# Patient Record
Sex: Female | Born: 1975 | Race: White | Hispanic: No | Smoking: Never smoker
Health system: Southern US, Community
[De-identification: ages and names within clinical notes are randomized; demographics above are authoritative.]

## PROBLEM LIST (undated history)

## (undated) DIAGNOSIS — F419 Anxiety disorder, unspecified: Secondary | ICD-10-CM

## (undated) DIAGNOSIS — N809 Endometriosis, unspecified: Secondary | ICD-10-CM

## (undated) HISTORY — DX: Anxiety disorder, unspecified: F41.9

## (undated) HISTORY — DX: Endometriosis, unspecified: N80.9

---

## 1998-01-04 HISTORY — PX: TUBAL LIGATION: SHX77

## 1999-06-23 ENCOUNTER — Other Ambulatory Visit: Admission: RE | Admit: 1999-06-23 | Discharge: 1999-06-23 | Payer: Self-pay | Admitting: Obstetrics and Gynecology

## 2000-08-09 ENCOUNTER — Other Ambulatory Visit: Admission: RE | Admit: 2000-08-09 | Discharge: 2000-08-09 | Payer: Self-pay | Admitting: Obstetrics and Gynecology

## 2001-03-30 ENCOUNTER — Ambulatory Visit (HOSPITAL_COMMUNITY): Admission: RE | Admit: 2001-03-30 | Discharge: 2001-03-30 | Payer: Self-pay | Admitting: Obstetrics and Gynecology

## 2002-01-04 HISTORY — PX: CHOLECYSTECTOMY, LAPAROSCOPIC: SHX56

## 2002-12-13 ENCOUNTER — Inpatient Hospital Stay (HOSPITAL_COMMUNITY): Admission: EM | Admit: 2002-12-13 | Discharge: 2002-12-14 | Payer: Self-pay | Admitting: Emergency Medicine

## 2016-05-15 ENCOUNTER — Emergency Department (HOSPITAL_COMMUNITY)
Admission: EM | Admit: 2016-05-15 | Discharge: 2016-05-15 | Disposition: A | Discharge status: Home / Self Care | Payer: BLUE CROSS/BLUE SHIELD | Attending: Emergency Medicine | Admitting: Emergency Medicine

## 2016-05-15 ENCOUNTER — Emergency Department (HOSPITAL_COMMUNITY): Payer: BLUE CROSS/BLUE SHIELD

## 2016-05-15 ENCOUNTER — Encounter (HOSPITAL_COMMUNITY): Payer: Self-pay | Admitting: Emergency Medicine

## 2016-05-15 DIAGNOSIS — S199XXA Unspecified injury of neck, initial encounter: Secondary | ICD-10-CM | POA: Diagnosis not present

## 2016-05-15 DIAGNOSIS — S8002XA Contusion of left knee, initial encounter: Secondary | ICD-10-CM | POA: Diagnosis not present

## 2016-05-15 DIAGNOSIS — S301XXA Contusion of abdominal wall, initial encounter: Secondary | ICD-10-CM | POA: Insufficient documentation

## 2016-05-15 DIAGNOSIS — S8012XA Contusion of left lower leg, initial encounter: Secondary | ICD-10-CM | POA: Insufficient documentation

## 2016-05-15 DIAGNOSIS — S3993XA Unspecified injury of pelvis, initial encounter: Secondary | ICD-10-CM | POA: Diagnosis not present

## 2016-05-15 DIAGNOSIS — S161XXA Strain of muscle, fascia and tendon at neck level, initial encounter: Secondary | ICD-10-CM

## 2016-05-15 DIAGNOSIS — Y999 Unspecified external cause status: Secondary | ICD-10-CM | POA: Insufficient documentation

## 2016-05-15 DIAGNOSIS — S0990XA Unspecified injury of head, initial encounter: Secondary | ICD-10-CM | POA: Diagnosis not present

## 2016-05-15 DIAGNOSIS — R079 Chest pain, unspecified: Secondary | ICD-10-CM | POA: Diagnosis not present

## 2016-05-15 DIAGNOSIS — R109 Unspecified abdominal pain: Secondary | ICD-10-CM | POA: Diagnosis not present

## 2016-05-15 DIAGNOSIS — S20211A Contusion of right front wall of thorax, initial encounter: Secondary | ICD-10-CM | POA: Diagnosis not present

## 2016-05-15 DIAGNOSIS — R51 Headache: Secondary | ICD-10-CM | POA: Diagnosis not present

## 2016-05-15 DIAGNOSIS — R102 Pelvic and perineal pain: Secondary | ICD-10-CM | POA: Diagnosis not present

## 2016-05-15 DIAGNOSIS — S8992XA Unspecified injury of left lower leg, initial encounter: Secondary | ICD-10-CM | POA: Diagnosis not present

## 2016-05-15 DIAGNOSIS — Y92410 Unspecified street and highway as the place of occurrence of the external cause: Secondary | ICD-10-CM | POA: Diagnosis not present

## 2016-05-15 DIAGNOSIS — Y939 Activity, unspecified: Secondary | ICD-10-CM | POA: Diagnosis not present

## 2016-05-15 DIAGNOSIS — M542 Cervicalgia: Secondary | ICD-10-CM | POA: Diagnosis not present

## 2016-05-15 DIAGNOSIS — M79662 Pain in left lower leg: Secondary | ICD-10-CM | POA: Diagnosis not present

## 2016-05-15 DIAGNOSIS — S299XXA Unspecified injury of thorax, initial encounter: Secondary | ICD-10-CM | POA: Diagnosis not present

## 2016-05-15 DIAGNOSIS — S3991XA Unspecified injury of abdomen, initial encounter: Secondary | ICD-10-CM | POA: Diagnosis not present

## 2016-05-15 LAB — URINALYSIS, ROUTINE W REFLEX MICROSCOPIC
Bilirubin Urine: NEGATIVE
Glucose, UA: NEGATIVE mg/dL
Hgb urine dipstick: NEGATIVE
Ketones, ur: 5 mg/dL — AB
LEUKOCYTES UA: NEGATIVE
NITRITE: NEGATIVE
PROTEIN: NEGATIVE mg/dL
Specific Gravity, Urine: 1.018 (ref 1.005–1.030)
pH: 5 (ref 5.0–8.0)

## 2016-05-15 LAB — BASIC METABOLIC PANEL
Anion gap: 9 (ref 5–15)
BUN: 10 mg/dL (ref 6–20)
CALCIUM: 9 mg/dL (ref 8.9–10.3)
CO2: 24 mmol/L (ref 22–32)
Chloride: 106 mmol/L (ref 101–111)
Creatinine, Ser: 0.7 mg/dL (ref 0.44–1.00)
Glucose, Bld: 100 mg/dL — ABNORMAL HIGH (ref 65–99)
Potassium: 3.7 mmol/L (ref 3.5–5.1)
SODIUM: 139 mmol/L (ref 135–145)

## 2016-05-15 LAB — CBC WITH DIFFERENTIAL/PLATELET
BASOS PCT: 0 %
Basophils Absolute: 0 10*3/uL (ref 0.0–0.1)
EOS ABS: 0.1 10*3/uL (ref 0.0–0.7)
EOS PCT: 2 %
HCT: 35.1 % — ABNORMAL LOW (ref 36.0–46.0)
Hemoglobin: 11.8 g/dL — ABNORMAL LOW (ref 12.0–15.0)
Lymphocytes Relative: 25 %
Lymphs Abs: 1.9 10*3/uL (ref 0.7–4.0)
MCH: 28.8 pg (ref 26.0–34.0)
MCHC: 33.6 g/dL (ref 30.0–36.0)
MCV: 85.6 fL (ref 78.0–100.0)
Monocytes Absolute: 0.4 10*3/uL (ref 0.1–1.0)
Monocytes Relative: 6 %
Neutro Abs: 5.2 10*3/uL (ref 1.7–7.7)
Neutrophils Relative %: 67 %
PLATELETS: 345 10*3/uL (ref 150–400)
RBC: 4.1 MIL/uL (ref 3.87–5.11)
RDW: 12.4 % (ref 11.5–15.5)
WBC: 7.7 10*3/uL (ref 4.0–10.5)

## 2016-05-15 LAB — PREGNANCY, URINE: PREG TEST UR: NEGATIVE

## 2016-05-15 MED ORDER — HYDROCODONE-ACETAMINOPHEN 5-325 MG PO TABS: 1.0000 | ORAL_TABLET | ORAL | 0 refills | Status: DC | PRN

## 2016-05-15 MED ORDER — IOPAMIDOL (ISOVUE-300) INJECTION 61%
INTRAVENOUS | Status: AC
  Administered 2016-05-15: 100 mL via INTRAVENOUS
  Filled 2016-05-15: qty 100

## 2016-05-15 MED ORDER — SODIUM CHLORIDE 0.9 % IV BOLUS (SEPSIS)
1000.0000 mL | Freq: Once | INTRAVENOUS | Status: AC
  Administered 2016-05-15: 1000 mL via INTRAVENOUS

## 2016-05-15 MED ORDER — CYCLOBENZAPRINE HCL 10 MG PO TABS: 10.0000 mg | ORAL_TABLET | Freq: Two times a day (BID) | ORAL | 0 refills | Status: DC | PRN

## 2016-05-15 MED ORDER — IBUPROFEN 600 MG PO TABS: 600.0000 mg | ORAL_TABLET | Freq: Four times a day (QID) | ORAL | 0 refills | Status: DC | PRN
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
## 2016-05-17 DIAGNOSIS — S335XXA Sprain of ligaments of lumbar spine, initial encounter: Secondary | ICD-10-CM | POA: Diagnosis not present

## 2016-05-17 DIAGNOSIS — S134XXA Sprain of ligaments of cervical spine, initial encounter: Secondary | ICD-10-CM | POA: Diagnosis not present

## 2016-05-17 DIAGNOSIS — S233XXA Sprain of ligaments of thoracic spine, initial encounter: Secondary | ICD-10-CM | POA: Diagnosis not present

## 2016-05-17 DIAGNOSIS — M9900 Segmental and somatic dysfunction of head region: Secondary | ICD-10-CM | POA: Diagnosis not present

## 2016-05-18 DIAGNOSIS — S233XXA Sprain of ligaments of thoracic spine, initial encounter: Secondary | ICD-10-CM | POA: Diagnosis not present

## 2016-05-18 DIAGNOSIS — S134XXA Sprain of ligaments of cervical spine, initial encounter: Secondary | ICD-10-CM | POA: Diagnosis not present

## 2016-05-18 DIAGNOSIS — S335XXA Sprain of ligaments of lumbar spine, initial encounter: Secondary | ICD-10-CM | POA: Diagnosis not present

## 2016-05-18 DIAGNOSIS — M9900 Segmental and somatic dysfunction of head region: Secondary | ICD-10-CM | POA: Diagnosis not present

## 2016-05-19 DIAGNOSIS — S335XXA Sprain of ligaments of lumbar spine, initial encounter: Secondary | ICD-10-CM | POA: Diagnosis not present

## 2016-05-19 DIAGNOSIS — M9900 Segmental and somatic dysfunction of head region: Secondary | ICD-10-CM | POA: Diagnosis not present

## 2016-05-19 DIAGNOSIS — S233XXA Sprain of ligaments of thoracic spine, initial encounter: Secondary | ICD-10-CM | POA: Diagnosis not present

## 2016-05-19 DIAGNOSIS — S134XXA Sprain of ligaments of cervical spine, initial encounter: Secondary | ICD-10-CM | POA: Diagnosis not present

## 2016-05-20 DIAGNOSIS — S233XXA Sprain of ligaments of thoracic spine, initial encounter: Secondary | ICD-10-CM | POA: Diagnosis not present

## 2016-05-20 DIAGNOSIS — S335XXA Sprain of ligaments of lumbar spine, initial encounter: Secondary | ICD-10-CM | POA: Diagnosis not present

## 2016-05-20 DIAGNOSIS — S134XXA Sprain of ligaments of cervical spine, initial encounter: Secondary | ICD-10-CM | POA: Diagnosis not present

## 2016-05-20 DIAGNOSIS — M9900 Segmental and somatic dysfunction of head region: Secondary | ICD-10-CM | POA: Diagnosis not present

## 2016-05-24 DIAGNOSIS — S335XXA Sprain of ligaments of lumbar spine, initial encounter: Secondary | ICD-10-CM | POA: Diagnosis not present

## 2016-05-24 DIAGNOSIS — S233XXA Sprain of ligaments of thoracic spine, initial encounter: Secondary | ICD-10-CM | POA: Diagnosis not present

## 2016-05-24 DIAGNOSIS — S134XXA Sprain of ligaments of cervical spine, initial encounter: Secondary | ICD-10-CM | POA: Diagnosis not present

## 2016-05-24 DIAGNOSIS — M9900 Segmental and somatic dysfunction of head region: Secondary | ICD-10-CM | POA: Diagnosis not present

## 2016-05-25 DIAGNOSIS — S134XXA Sprain of ligaments of cervical spine, initial encounter: Secondary | ICD-10-CM | POA: Diagnosis not present

## 2016-05-25 DIAGNOSIS — S335XXA Sprain of ligaments of lumbar spine, initial encounter: Secondary | ICD-10-CM | POA: Diagnosis not present

## 2016-05-25 DIAGNOSIS — S233XXA Sprain of ligaments of thoracic spine, initial encounter: Secondary | ICD-10-CM | POA: Diagnosis not present

## 2016-05-25 DIAGNOSIS — M9900 Segmental and somatic dysfunction of head region: Secondary | ICD-10-CM | POA: Diagnosis not present

## 2016-05-27 DIAGNOSIS — S134XXA Sprain of ligaments of cervical spine, initial encounter: Secondary | ICD-10-CM | POA: Diagnosis not present

## 2016-05-27 DIAGNOSIS — S233XXA Sprain of ligaments of thoracic spine, initial encounter: Secondary | ICD-10-CM | POA: Diagnosis not present

## 2016-05-27 DIAGNOSIS — M9900 Segmental and somatic dysfunction of head region: Secondary | ICD-10-CM | POA: Diagnosis not present

## 2016-05-27 DIAGNOSIS — S335XXA Sprain of ligaments of lumbar spine, initial encounter: Secondary | ICD-10-CM | POA: Diagnosis not present

## 2016-06-01 DIAGNOSIS — S233XXA Sprain of ligaments of thoracic spine, initial encounter: Secondary | ICD-10-CM | POA: Diagnosis not present

## 2016-06-01 DIAGNOSIS — S335XXA Sprain of ligaments of lumbar spine, initial encounter: Secondary | ICD-10-CM | POA: Diagnosis not present

## 2016-06-01 DIAGNOSIS — S134XXA Sprain of ligaments of cervical spine, initial encounter: Secondary | ICD-10-CM | POA: Diagnosis not present

## 2016-06-01 DIAGNOSIS — M9900 Segmental and somatic dysfunction of head region: Secondary | ICD-10-CM | POA: Diagnosis not present

## 2016-06-03 DIAGNOSIS — S233XXA Sprain of ligaments of thoracic spine, initial encounter: Secondary | ICD-10-CM | POA: Diagnosis not present

## 2016-06-03 DIAGNOSIS — M9900 Segmental and somatic dysfunction of head region: Secondary | ICD-10-CM | POA: Diagnosis not present

## 2016-06-03 DIAGNOSIS — S134XXA Sprain of ligaments of cervical spine, initial encounter: Secondary | ICD-10-CM | POA: Diagnosis not present

## 2016-06-03 DIAGNOSIS — S335XXA Sprain of ligaments of lumbar spine, initial encounter: Secondary | ICD-10-CM | POA: Diagnosis not present

## 2016-06-07 DIAGNOSIS — S134XXA Sprain of ligaments of cervical spine, initial encounter: Secondary | ICD-10-CM | POA: Diagnosis not present

## 2016-06-07 DIAGNOSIS — S335XXA Sprain of ligaments of lumbar spine, initial encounter: Secondary | ICD-10-CM | POA: Diagnosis not present

## 2016-06-07 DIAGNOSIS — M9900 Segmental and somatic dysfunction of head region: Secondary | ICD-10-CM | POA: Diagnosis not present

## 2016-06-07 DIAGNOSIS — S233XXA Sprain of ligaments of thoracic spine, initial encounter: Secondary | ICD-10-CM | POA: Diagnosis not present

## 2016-06-09 DIAGNOSIS — S335XXA Sprain of ligaments of lumbar spine, initial encounter: Secondary | ICD-10-CM | POA: Diagnosis not present

## 2016-06-09 DIAGNOSIS — M9900 Segmental and somatic dysfunction of head region: Secondary | ICD-10-CM | POA: Diagnosis not present

## 2016-06-09 DIAGNOSIS — S233XXA Sprain of ligaments of thoracic spine, initial encounter: Secondary | ICD-10-CM | POA: Diagnosis not present

## 2016-06-09 DIAGNOSIS — S134XXA Sprain of ligaments of cervical spine, initial encounter: Secondary | ICD-10-CM | POA: Diagnosis not present

## 2016-06-10 DIAGNOSIS — S134XXA Sprain of ligaments of cervical spine, initial encounter: Secondary | ICD-10-CM | POA: Diagnosis not present

## 2016-06-10 DIAGNOSIS — S335XXA Sprain of ligaments of lumbar spine, initial encounter: Secondary | ICD-10-CM | POA: Diagnosis not present

## 2016-06-10 DIAGNOSIS — S233XXA Sprain of ligaments of thoracic spine, initial encounter: Secondary | ICD-10-CM | POA: Diagnosis not present

## 2016-06-10 DIAGNOSIS — M9900 Segmental and somatic dysfunction of head region: Secondary | ICD-10-CM | POA: Diagnosis not present

## 2016-06-14 DIAGNOSIS — S134XXA Sprain of ligaments of cervical spine, initial encounter: Secondary | ICD-10-CM | POA: Diagnosis not present

## 2016-06-14 DIAGNOSIS — S233XXA Sprain of ligaments of thoracic spine, initial encounter: Secondary | ICD-10-CM | POA: Diagnosis not present

## 2016-06-14 DIAGNOSIS — M9900 Segmental and somatic dysfunction of head region: Secondary | ICD-10-CM | POA: Diagnosis not present

## 2016-06-14 DIAGNOSIS — S335XXA Sprain of ligaments of lumbar spine, initial encounter: Secondary | ICD-10-CM | POA: Diagnosis not present

## 2016-06-16 DIAGNOSIS — S233XXA Sprain of ligaments of thoracic spine, initial encounter: Secondary | ICD-10-CM | POA: Diagnosis not present

## 2016-06-16 DIAGNOSIS — S134XXA Sprain of ligaments of cervical spine, initial encounter: Secondary | ICD-10-CM | POA: Diagnosis not present

## 2016-06-16 DIAGNOSIS — S335XXA Sprain of ligaments of lumbar spine, initial encounter: Secondary | ICD-10-CM | POA: Diagnosis not present

## 2016-06-16 DIAGNOSIS — M9900 Segmental and somatic dysfunction of head region: Secondary | ICD-10-CM | POA: Diagnosis not present

## 2016-06-17 DIAGNOSIS — S134XXA Sprain of ligaments of cervical spine, initial encounter: Secondary | ICD-10-CM | POA: Diagnosis not present

## 2016-06-17 DIAGNOSIS — S335XXA Sprain of ligaments of lumbar spine, initial encounter: Secondary | ICD-10-CM | POA: Diagnosis not present

## 2016-06-17 DIAGNOSIS — M9900 Segmental and somatic dysfunction of head region: Secondary | ICD-10-CM | POA: Diagnosis not present

## 2016-06-17 DIAGNOSIS — S233XXA Sprain of ligaments of thoracic spine, initial encounter: Secondary | ICD-10-CM | POA: Diagnosis not present

## 2016-06-21 DIAGNOSIS — S134XXA Sprain of ligaments of cervical spine, initial encounter: Secondary | ICD-10-CM | POA: Diagnosis not present

## 2016-06-21 DIAGNOSIS — S335XXA Sprain of ligaments of lumbar spine, initial encounter: Secondary | ICD-10-CM | POA: Diagnosis not present

## 2016-06-21 DIAGNOSIS — M9900 Segmental and somatic dysfunction of head region: Secondary | ICD-10-CM | POA: Diagnosis not present

## 2016-06-21 DIAGNOSIS — S233XXA Sprain of ligaments of thoracic spine, initial encounter: Secondary | ICD-10-CM | POA: Diagnosis not present

## 2016-06-22 DIAGNOSIS — M238X2 Other internal derangements of left knee: Secondary | ICD-10-CM | POA: Diagnosis not present

## 2016-06-24 DIAGNOSIS — S335XXA Sprain of ligaments of lumbar spine, initial encounter: Secondary | ICD-10-CM | POA: Diagnosis not present

## 2016-06-24 DIAGNOSIS — S134XXA Sprain of ligaments of cervical spine, initial encounter: Secondary | ICD-10-CM | POA: Diagnosis not present

## 2016-06-24 DIAGNOSIS — S233XXA Sprain of ligaments of thoracic spine, initial encounter: Secondary | ICD-10-CM | POA: Diagnosis not present

## 2016-06-24 DIAGNOSIS — M9900 Segmental and somatic dysfunction of head region: Secondary | ICD-10-CM | POA: Diagnosis not present

## 2016-06-26 DIAGNOSIS — M25562 Pain in left knee: Secondary | ICD-10-CM | POA: Diagnosis not present

## 2016-06-29 DIAGNOSIS — M9900 Segmental and somatic dysfunction of head region: Secondary | ICD-10-CM | POA: Diagnosis not present

## 2016-06-29 DIAGNOSIS — S335XXA Sprain of ligaments of lumbar spine, initial encounter: Secondary | ICD-10-CM | POA: Diagnosis not present

## 2016-06-29 DIAGNOSIS — S134XXA Sprain of ligaments of cervical spine, initial encounter: Secondary | ICD-10-CM | POA: Diagnosis not present

## 2016-06-29 DIAGNOSIS — S233XXA Sprain of ligaments of thoracic spine, initial encounter: Secondary | ICD-10-CM | POA: Diagnosis not present

## 2016-07-01 DIAGNOSIS — M9900 Segmental and somatic dysfunction of head region: Secondary | ICD-10-CM | POA: Diagnosis not present

## 2016-07-01 DIAGNOSIS — S233XXA Sprain of ligaments of thoracic spine, initial encounter: Secondary | ICD-10-CM | POA: Diagnosis not present

## 2016-07-01 DIAGNOSIS — S335XXA Sprain of ligaments of lumbar spine, initial encounter: Secondary | ICD-10-CM | POA: Diagnosis not present

## 2016-07-01 DIAGNOSIS — S134XXA Sprain of ligaments of cervical spine, initial encounter: Secondary | ICD-10-CM | POA: Diagnosis not present

## 2016-07-05 DIAGNOSIS — S134XXA Sprain of ligaments of cervical spine, initial encounter: Secondary | ICD-10-CM | POA: Diagnosis not present

## 2016-07-05 DIAGNOSIS — M9900 Segmental and somatic dysfunction of head region: Secondary | ICD-10-CM | POA: Diagnosis not present

## 2016-07-05 DIAGNOSIS — S233XXA Sprain of ligaments of thoracic spine, initial encounter: Secondary | ICD-10-CM | POA: Diagnosis not present

## 2016-07-05 DIAGNOSIS — S335XXA Sprain of ligaments of lumbar spine, initial encounter: Secondary | ICD-10-CM | POA: Diagnosis not present

## 2016-07-08 DIAGNOSIS — S335XXA Sprain of ligaments of lumbar spine, initial encounter: Secondary | ICD-10-CM | POA: Diagnosis not present

## 2016-07-08 DIAGNOSIS — S233XXA Sprain of ligaments of thoracic spine, initial encounter: Secondary | ICD-10-CM | POA: Diagnosis not present

## 2016-07-08 DIAGNOSIS — M9900 Segmental and somatic dysfunction of head region: Secondary | ICD-10-CM | POA: Diagnosis not present

## 2016-07-08 DIAGNOSIS — S134XXA Sprain of ligaments of cervical spine, initial encounter: Secondary | ICD-10-CM | POA: Diagnosis not present

## 2016-07-13 DIAGNOSIS — M25562 Pain in left knee: Secondary | ICD-10-CM | POA: Diagnosis not present

## 2016-07-14 DIAGNOSIS — S335XXA Sprain of ligaments of lumbar spine, initial encounter: Secondary | ICD-10-CM | POA: Diagnosis not present

## 2016-07-14 DIAGNOSIS — S233XXA Sprain of ligaments of thoracic spine, initial encounter: Secondary | ICD-10-CM | POA: Diagnosis not present

## 2016-07-14 DIAGNOSIS — S134XXA Sprain of ligaments of cervical spine, initial encounter: Secondary | ICD-10-CM | POA: Diagnosis not present

## 2016-07-14 DIAGNOSIS — M9900 Segmental and somatic dysfunction of head region: Secondary | ICD-10-CM | POA: Diagnosis not present

## 2016-07-16 DIAGNOSIS — S335XXA Sprain of ligaments of lumbar spine, initial encounter: Secondary | ICD-10-CM | POA: Diagnosis not present

## 2016-07-16 DIAGNOSIS — S134XXA Sprain of ligaments of cervical spine, initial encounter: Secondary | ICD-10-CM | POA: Diagnosis not present

## 2016-07-16 DIAGNOSIS — M9900 Segmental and somatic dysfunction of head region: Secondary | ICD-10-CM | POA: Diagnosis not present

## 2016-07-16 DIAGNOSIS — S233XXA Sprain of ligaments of thoracic spine, initial encounter: Secondary | ICD-10-CM | POA: Diagnosis not present

## 2016-07-21 DIAGNOSIS — M9900 Segmental and somatic dysfunction of head region: Secondary | ICD-10-CM | POA: Diagnosis not present

## 2016-07-21 DIAGNOSIS — S233XXA Sprain of ligaments of thoracic spine, initial encounter: Secondary | ICD-10-CM | POA: Diagnosis not present

## 2016-07-21 DIAGNOSIS — S335XXA Sprain of ligaments of lumbar spine, initial encounter: Secondary | ICD-10-CM | POA: Diagnosis not present

## 2016-07-21 DIAGNOSIS — S134XXA Sprain of ligaments of cervical spine, initial encounter: Secondary | ICD-10-CM | POA: Diagnosis not present

## 2016-07-29 DIAGNOSIS — S335XXA Sprain of ligaments of lumbar spine, initial encounter: Secondary | ICD-10-CM | POA: Diagnosis not present

## 2016-07-29 DIAGNOSIS — M9900 Segmental and somatic dysfunction of head region: Secondary | ICD-10-CM | POA: Diagnosis not present

## 2016-07-29 DIAGNOSIS — S233XXA Sprain of ligaments of thoracic spine, initial encounter: Secondary | ICD-10-CM | POA: Diagnosis not present

## 2016-07-29 DIAGNOSIS — S134XXA Sprain of ligaments of cervical spine, initial encounter: Secondary | ICD-10-CM | POA: Diagnosis not present

## 2016-08-09 DIAGNOSIS — S335XXA Sprain of ligaments of lumbar spine, initial encounter: Secondary | ICD-10-CM | POA: Diagnosis not present

## 2016-08-09 DIAGNOSIS — S233XXA Sprain of ligaments of thoracic spine, initial encounter: Secondary | ICD-10-CM | POA: Diagnosis not present

## 2016-08-09 DIAGNOSIS — M9900 Segmental and somatic dysfunction of head region: Secondary | ICD-10-CM | POA: Diagnosis not present

## 2016-08-09 DIAGNOSIS — S134XXA Sprain of ligaments of cervical spine, initial encounter: Secondary | ICD-10-CM | POA: Diagnosis not present

## 2016-08-17 DIAGNOSIS — M23612 Other spontaneous disruption of anterior cruciate ligament of left knee: Secondary | ICD-10-CM | POA: Diagnosis not present

## 2016-08-17 DIAGNOSIS — S8002XD Contusion of left knee, subsequent encounter: Secondary | ICD-10-CM | POA: Diagnosis not present

## 2016-08-17 DIAGNOSIS — M25562 Pain in left knee: Secondary | ICD-10-CM | POA: Diagnosis not present

## 2016-08-17 DIAGNOSIS — R531 Weakness: Secondary | ICD-10-CM | POA: Diagnosis not present

## 2016-08-19 DIAGNOSIS — S233XXA Sprain of ligaments of thoracic spine, initial encounter: Secondary | ICD-10-CM | POA: Diagnosis not present

## 2016-08-19 DIAGNOSIS — M9900 Segmental and somatic dysfunction of head region: Secondary | ICD-10-CM | POA: Diagnosis not present

## 2016-08-19 DIAGNOSIS — S134XXA Sprain of ligaments of cervical spine, initial encounter: Secondary | ICD-10-CM | POA: Diagnosis not present

## 2016-08-19 DIAGNOSIS — S335XXA Sprain of ligaments of lumbar spine, initial encounter: Secondary | ICD-10-CM | POA: Diagnosis not present

## 2016-08-23 DIAGNOSIS — R531 Weakness: Secondary | ICD-10-CM | POA: Diagnosis not present

## 2016-08-23 DIAGNOSIS — S8002XD Contusion of left knee, subsequent encounter: Secondary | ICD-10-CM | POA: Diagnosis not present

## 2016-08-23 DIAGNOSIS — M25562 Pain in left knee: Secondary | ICD-10-CM | POA: Diagnosis not present

## 2016-08-23 DIAGNOSIS — M23612 Other spontaneous disruption of anterior cruciate ligament of left knee: Secondary | ICD-10-CM | POA: Diagnosis not present

## 2016-08-26 DIAGNOSIS — S8002XD Contusion of left knee, subsequent encounter: Secondary | ICD-10-CM | POA: Diagnosis not present

## 2016-08-26 DIAGNOSIS — R531 Weakness: Secondary | ICD-10-CM | POA: Diagnosis not present

## 2016-08-26 DIAGNOSIS — M25562 Pain in left knee: Secondary | ICD-10-CM | POA: Diagnosis not present

## 2016-08-26 DIAGNOSIS — M23612 Other spontaneous disruption of anterior cruciate ligament of left knee: Secondary | ICD-10-CM | POA: Diagnosis not present

## 2016-08-31 DIAGNOSIS — M23612 Other spontaneous disruption of anterior cruciate ligament of left knee: Secondary | ICD-10-CM | POA: Diagnosis not present

## 2016-09-01 DIAGNOSIS — S335XXA Sprain of ligaments of lumbar spine, initial encounter: Secondary | ICD-10-CM | POA: Diagnosis not present

## 2016-09-01 DIAGNOSIS — M9900 Segmental and somatic dysfunction of head region: Secondary | ICD-10-CM | POA: Diagnosis not present

## 2016-09-01 DIAGNOSIS — S233XXA Sprain of ligaments of thoracic spine, initial encounter: Secondary | ICD-10-CM | POA: Diagnosis not present

## 2016-09-01 DIAGNOSIS — S134XXA Sprain of ligaments of cervical spine, initial encounter: Secondary | ICD-10-CM | POA: Diagnosis not present

## 2016-09-15 DIAGNOSIS — M9900 Segmental and somatic dysfunction of head region: Secondary | ICD-10-CM | POA: Diagnosis not present

## 2016-09-15 DIAGNOSIS — S134XXA Sprain of ligaments of cervical spine, initial encounter: Secondary | ICD-10-CM | POA: Diagnosis not present

## 2016-09-15 DIAGNOSIS — S335XXA Sprain of ligaments of lumbar spine, initial encounter: Secondary | ICD-10-CM | POA: Diagnosis not present

## 2016-09-15 DIAGNOSIS — S233XXA Sprain of ligaments of thoracic spine, initial encounter: Secondary | ICD-10-CM | POA: Diagnosis not present

## 2016-10-06 DIAGNOSIS — M9900 Segmental and somatic dysfunction of head region: Secondary | ICD-10-CM | POA: Diagnosis not present

## 2016-10-06 DIAGNOSIS — S233XXA Sprain of ligaments of thoracic spine, initial encounter: Secondary | ICD-10-CM | POA: Diagnosis not present

## 2016-10-06 DIAGNOSIS — S134XXA Sprain of ligaments of cervical spine, initial encounter: Secondary | ICD-10-CM | POA: Diagnosis not present

## 2016-10-06 DIAGNOSIS — S335XXA Sprain of ligaments of lumbar spine, initial encounter: Secondary | ICD-10-CM | POA: Diagnosis not present

## 2016-11-02 DIAGNOSIS — M23612 Other spontaneous disruption of anterior cruciate ligament of left knee: Secondary | ICD-10-CM | POA: Diagnosis not present

## 2016-12-02 DIAGNOSIS — M9900 Segmental and somatic dysfunction of head region: Secondary | ICD-10-CM | POA: Diagnosis not present

## 2016-12-02 DIAGNOSIS — S134XXA Sprain of ligaments of cervical spine, initial encounter: Secondary | ICD-10-CM | POA: Diagnosis not present

## 2016-12-02 DIAGNOSIS — S335XXA Sprain of ligaments of lumbar spine, initial encounter: Secondary | ICD-10-CM | POA: Diagnosis not present

## 2016-12-02 DIAGNOSIS — S233XXA Sprain of ligaments of thoracic spine, initial encounter: Secondary | ICD-10-CM | POA: Diagnosis not present

## 2017-05-03 DIAGNOSIS — M23612 Other spontaneous disruption of anterior cruciate ligament of left knee: Secondary | ICD-10-CM | POA: Diagnosis not present
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
## 2017-06-22 DIAGNOSIS — M23612 Other spontaneous disruption of anterior cruciate ligament of left knee: Secondary | ICD-10-CM | POA: Diagnosis not present

## 2018-03-24 ENCOUNTER — Ambulatory Visit (INDEPENDENT_AMBULATORY_CARE_PROVIDER_SITE_OTHER): Payer: BLUE CROSS/BLUE SHIELD | Admitting: Obstetrics & Gynecology

## 2018-03-24 ENCOUNTER — Other Ambulatory Visit (HOSPITAL_COMMUNITY)
Admission: RE | Admit: 2018-03-24 | Discharge: 2018-03-24 | Disposition: A | Discharge status: Home / Self Care | Payer: BLUE CROSS/BLUE SHIELD | Source: Ambulatory Visit | Attending: Obstetrics & Gynecology | Admitting: Obstetrics & Gynecology

## 2018-03-24 ENCOUNTER — Other Ambulatory Visit: Payer: Self-pay

## 2018-03-24 ENCOUNTER — Encounter: Payer: Self-pay | Admitting: Obstetrics & Gynecology

## 2018-03-24 VITALS — BP 136/76 | HR 80 | Resp 16 | Ht 63.5 in | Wt 181.8 lb

## 2018-03-24 DIAGNOSIS — Z6831 Body mass index (BMI) 31.0-31.9, adult: Secondary | ICD-10-CM | POA: Diagnosis not present

## 2018-03-24 DIAGNOSIS — Z124 Encounter for screening for malignant neoplasm of cervix: Secondary | ICD-10-CM

## 2018-03-24 DIAGNOSIS — N898 Other specified noninflammatory disorders of vagina: Secondary | ICD-10-CM

## 2018-03-24 DIAGNOSIS — N930 Postcoital and contact bleeding: Secondary | ICD-10-CM | POA: Insufficient documentation

## 2018-03-24 MED ORDER — CITALOPRAM HYDROBROMIDE 20 MG PO TABS: ORAL_TABLET | ORAL | 1 refills | Status: DC

## 2018-03-24 NOTE — Progress Notes (Signed)
43 y.o. G33P1001 Divorced White or Caucasian female here for new patient annual exam, bleeding with intercourse.  That has been going on since October.  This occurs after every episode of intercourse.  It can be very heavy and is bright red.  Cycles are irregular and can be as frequent as every 19 days up to 25 days.  Cycles last 3-4 days.  Usually one day is heavy.  On this day she uses super plus tampons that she needs to change every 2 hours.  She has a lot of cramping and does sometimes pass clots.  Cramping is always more severe in the lower right pelvis, lateral to uterus.    Has been with current partner.   Having some increased anxiety and she feels she has more emotional symptoms before her menstrual cycle.  This has worsened over the past months.  Reports her mother is a Teacher, early years/pre and recommend she bring this up today.  She has been treated with Wellbutrin, Zoloft and Celexa in the past.  Feels like worry is the biggest   Patient's last menstrual period was 03/15/2018 (exact date).          Sexually active: Yes.    The current method of family planning is tubal ligation.    Exercising: Yes.    walk Smoker:  no  Health Maintenance: Pap:  2018 Normal.  Office closed in [place] where she went and she cannot get records any longer.  Has tried.   History of abnormal Pap:  no MMG:  Never TDaP:  Unsure  Screening Labs: ?   reports that she has never smoked. She has never used smokeless tobacco. She reports current alcohol use of about 4.0 standard drinks of alcohol per week. She reports that she does not use drugs.  Past Medical History:  Diagnosis Date  . Anxiety   . Endometriosis     Past Surgical History:  Procedure Laterality Date  . TUBAL LIGATION  2000    Current Outpatient Medications  Medication Sig Dispense Refill  . Multiple Vitamin (MULTIVITAMIN) tablet Take 1 tablet by mouth daily.    . Turmeric 400 MG CAPS Take by mouth.     No current facility-administered  medications for this visit.     Family History  Problem Relation Age of Onset  . Lymphoma Father   . Diabetes Maternal Grandmother   . Colon cancer Maternal Grandfather     Review of Systems  All other systems reviewed and are negative.   Exam:   BP 136/76 (BP Location: Right Arm, Patient Position: Sitting, Cuff Size: Large)   Pulse 80   Resp 16   Ht 5' 3.5" (1.613 m)   Wt 181 lb 12.8 oz (82.5 kg)   LMP 03/15/2018 (Exact Date)   BMI 31.70 kg/m      Height: 5' 3.5" (161.3 cm)  Ht Readings from Last 3 Encounters:  03/24/18 5' 3.5" (1.613 m)  05/15/16 5\' 6"  (1.676 m)    General appearance: alert, cooperative and appears stated age Abdomen: soft, non-tender; bowel sounds normal; no masses,  no organomegaly Extremities: extremities normal, atraumatic, no cyanosis or edema Skin: Skin color, texture, turgor normal. No rashes or lesions Lymph nodes: Cervical, supraclavicular, and axillary nodes normal. No abnormal inguinal nodes palpated Neurologic: Grossly normal   Pelvic: External genitalia:  no lesions              Urethra:  normal appearing urethra with no masses, tenderness or lesions  Bartholins and Skenes: normal                 Vagina: normal appearing vagina with normal color and discharge, no lesions              Cervix: no lesions and but very friable cervix              Pap taken: Yes.   Bimanual Exam:  Uterus:  enlarged, about 8-10 week but globular weeks size, mobile, mildly tender on the righ              Adnexa: no mass, fullness, tenderness               Rectovaginal: Confirms               Anus:  normal sphincter tone, no lesions  Chaperone was present for exam.  A:  Post coital bleeding Anxiety Compulsive thinking Globular uterus H/o endometriosis  P:   Mammogram guidelines reviewed.  She will scheduled.  Knows this is due Free T4, TSH Pap with HR HPV, trich and GC/Chl  Yeast and BV testing obtained as well today Consider PUS if above  testing is all normal She is going to look for her laparoscopy pictures  ~45 minutes spent with patient >50% of time was in face to face discussion of above.

## 2018-03-25 LAB — T4, FREE: Free T4: 1.61 ng/dL (ref 0.82–1.77)

## 2018-03-25 LAB — TSH: TSH: 2.41 u[IU]/mL (ref 0.450–4.500)

## 2018-03-27 LAB — NUSWAB VAGINITIS PLUS (VG+)
Atopobium vaginae: HIGH Score — AB
BVAB 2: HIGH Score — AB
CANDIDA ALBICANS, NAA: NEGATIVE
Candida glabrata, NAA: NEGATIVE
Chlamydia trachomatis, NAA: NEGATIVE
NEISSERIA GONORRHOEAE, NAA: NEGATIVE
TRICH VAG BY NAA: NEGATIVE

## 2018-03-28 LAB — CYTOLOGY - PAP
Diagnosis: UNDETERMINED — AB
HPV: DETECTED — AB

## 2018-03-29 ENCOUNTER — Telehealth: Payer: Self-pay

## 2018-03-29 MED ORDER — METRONIDAZOLE 500 MG PO TABS: 500.0000 mg | ORAL_TABLET | Freq: Two times a day (BID) | ORAL | 0 refills | Status: DC

## 2018-03-29 NOTE — Telephone Encounter (Signed)
-----   Message from Jerene Bears, MD sent at 03/29/2018  6:26 AM EDT ----- Please let pt know her TSH and free T4 were normal.  Her vaginitis testing was negative for STDs and yeast but positive for BV.  Ok to treat with Metrogel 0.75%, one applicator QHS x 5 nights OR flagyl 500mg  bid x 7 days.  Pap showed ASCUS cells with neg HR HPV.  This is a normal pap smear.  The ASCUS pap is present likely due to the BV that has been present.  I'd really like to recheck after two to three weeks to make sure the BV has resolved and see if her cervix is improved.  I think the post coital bleeding is likely from irritation due to the BV.  Thanks.

## 2018-03-29 NOTE — Telephone Encounter (Signed)
Spoke with patient. Results given. Patient verbalizes understanding. Rx for Flagyl 500 mg po BID x 7 days #14 0RF sent to pharmacy on file. Avoid alcohol during treatment and 24 hours after completing medication. Don't mix with alcohol if mixed can cause severe nausea, vomiting and abdominal cramping.  Patient verbalizes understanding. Recheck scheduled for 04/19/2018 at 1 pm with Dr.Miller. Patient is agreeable to date and time. Encounter closed.

## 2018-04-07 ENCOUNTER — Other Ambulatory Visit: Payer: Self-pay

## 2018-04-07 ENCOUNTER — Encounter: Payer: Self-pay | Admitting: Obstetrics and Gynecology

## 2018-04-07 ENCOUNTER — Ambulatory Visit (INDEPENDENT_AMBULATORY_CARE_PROVIDER_SITE_OTHER): Payer: BLUE CROSS/BLUE SHIELD | Admitting: Obstetrics and Gynecology

## 2018-04-07 VITALS — BP 108/70 | Temp 98.0°F | Wt 183.0 lb

## 2018-04-07 DIAGNOSIS — N939 Abnormal uterine and vaginal bleeding, unspecified: Secondary | ICD-10-CM

## 2018-04-07 DIAGNOSIS — N921 Excessive and frequent menstruation with irregular cycle: Secondary | ICD-10-CM | POA: Diagnosis not present

## 2018-04-07 DIAGNOSIS — Z23 Encounter for immunization: Secondary | ICD-10-CM

## 2018-04-07 DIAGNOSIS — Z7189 Other specified counseling: Secondary | ICD-10-CM | POA: Diagnosis not present

## 2018-04-07 DIAGNOSIS — Z113 Encounter for screening for infections with a predominantly sexual mode of transmission: Secondary | ICD-10-CM | POA: Diagnosis not present

## 2018-04-07 DIAGNOSIS — N9089 Other specified noninflammatory disorders of vulva and perineum: Secondary | ICD-10-CM

## 2018-04-07 DIAGNOSIS — Z7185 Encounter for immunization safety counseling: Secondary | ICD-10-CM

## 2018-04-07 DIAGNOSIS — R87610 Atypical squamous cells of undetermined significance on cytologic smear of cervix (ASC-US): Secondary | ICD-10-CM

## 2018-04-07 LAB — POCT URINE PREGNANCY: Preg Test, Ur: NEGATIVE

## 2018-04-07 MED ORDER — MEDROXYPROGESTERONE ACETATE 10 MG PO TABS: ORAL_TABLET | ORAL | 0 refills | Status: DC

## 2018-04-07 NOTE — Patient Instructions (Addendum)
Colposcopy Colposcopy is a procedure to examine the lowest part of the uterus (cervix), the vagina, and the area around the vaginal opening (vulva) for abnormalities or signs of disease. The procedure is done using a lighted microscope or magnifying lens (colposcope). If any unusual cells are found during the procedure, your health care provider may remove a tissue sample for testing (biopsy). A colposcopy may be done if you:  Have an abnormal Pap test. A Pap test is a screening test that is used to check for signs of cancer or infection of the vagina, cervix, and uterus.  Have a Pap smear test in which you test positive for high-risk HPV (human papillomavirus).  Have a sore or lesion on your cervix.  Have genital warts on your vulva, vagina, or cervix.  Took certain medicines while pregnant, such as diethylstilbestrol (DES).  Have pain during sexual intercourse.  Have vaginal bleeding, especially after sexual intercourse.  Need to have a cervical polyp removed.  Need to have a lost intrauterine device (IUD) string located. Let your health care provider know about:  Any allergies you have, including allergies to prescribed medicine, latex, or iodine.  All medicines you are taking, including vitamins, herbs, eye drops, creams, and over-the-counter medicines. Bring a list of all of your medicines to your appointment.  Any problems you or family members have had with anesthetic medicines.  Any blood disorders you have.  Any surgeries you have had.  Any medical conditions you have, such as pelvic inflammatory disease (PID) or endometrial disorder.  Any history of frequent fainting.  Your menstrual cycle and what form of birth control (contraception) you use.  Your medical history, including any prior cervical treatment.  Whether you are pregnant or may be pregnant. What are the risks? Generally, this is a safe procedure. However, problems may occur,  including:  Pain.  Infection, which may include a fever, bad-smelling discharge, or pelvic pain.  Bleeding or discharge.  Misdiagnosis.  Fainting and vasovagal reactions, but this is rare.  Allergic reactions to medicines.  Damage to other structures or organs. What happens before the procedure?  If you have your menstrual period or will have it at the time of your procedure, tell your health care provider. A colposcopy typically is not done during menstruation.  Continue your contraceptive practices before and after the procedure.  For 24 hours before the colposcopy: ? Do not douche. ? Do not use tampons. ? Do not use medicines, creams, or suppositories in the vagina. ? Do not have sexual intercourse.  Ask your health care provider about: ? Changing or stopping your regular medicines. This is especially important if you are taking diabetes medicines or blood thinners. ? Taking medicines such as aspirin and ibuprofen. These medicines can thin your blood. Do not take these medicines before your procedure if your health care provider instructs you not to. It is likely that your health care provider will tell you to avoid taking aspirin or medicine that contains aspirin for 7 days before the procedure.  Follow instructions from your health care provider about eating or drinking restrictions. You will likely need to eat a regular diet the day of the procedure and not skip any meals.  You may have an exam or testing. A pregnancy test will be taken on the day of the procedure.  You may have a blood or urine sample taken.  Plan to have someone take you home from the hospital or clinic.  If you will be going   home right after the procedure, plan to have someone with you for 24 hours. What happens during the procedure?  You will lie down on your back, with your feet in foot rests (stirrups).  A warmed and lubricated instrument (speculum) will be inserted into your vagina. The  speculum will be used to hold apart the walls of your vagina so your health care provider can see your cervix and the inside of your vagina.  A cotton swab will be used to place a small amount of liquid solution on the areas to be examined. This solution makes it easier to see abnormal cells. You may feel a slight burning during this part.  The colposcope will be used to scan the cervix with a bright white light. The colposcope will be held near your vulvaand will magnify your vulva, vagina, and cervix for easier examination.  Your health care provider may decide to take a biopsy. If so: ? You may be given medicine to numb the area (local anesthetic). ? Surgical instruments will be used to suck out mucus and cells through your vagina. ? You may feel mild pain while the tissue sample is removed. ? Bleeding may occur. A solution may be used to stop the bleeding. ? If a sample of tissue is needed from the inside of the cervix, a different procedure called endocervical curettage (ECC) may be completed. During this procedure, a curved instrument (curette) will be used to scrape cells from your cervix or the top of your cervix (endocervix).  Your health care provider will record the location of any abnormalities. The procedure may vary among health care providers and hospitals. What happens after the procedure?  You will lie down and rest for a few minutes. You may be offered juice or cookies.  Your blood pressure, heart rate, breathing rate, and blood oxygen level will be monitored until any medicines you were given have worn off.  You may have to wear compression stockings. These stockings help to prevent blood clots and reduce swelling in your legs.  You may have some cramping in your abdomen. This should go away after a few minutes. This information is not intended to replace advice given to you by your health care provider. Make sure you discuss any questions you have with your health care  provider. Document Released: 03/13/2002 Document Revised: 08/19/2015 Document Reviewed: 07/28/2015 Elsevier Interactive Patient Education  2019 [employer] Human Papillomavirus Human papillomavirus (HPV) is the most common sexually transmitted infection (STI). It easily spreads from person to person (is highly contagious). HPV infections cause genital warts. Certain types of HPV may cause cancers, including cancer of the lower part of the uterus (cervix), vagina, outer female genital area (vulva), penis, anus, and rectum. HPV may also cause cancers of the oral cavity, such as the throat, tongue, and tonsils. There are many types of HPV. It usually does not cause symptoms. However, sometimes there are wart-like lesions in the throat or warts in the genital area that you can see or feel. It is possible to be infected for long periods and pass HPV to others without knowing it. What are the causes? HPV is caused by a virus that spreads from person to person through sexual contact. This includes oral, vaginal, or anal sex. What increases the risk? The following factors may make you more likely to develop this condition:  Having unprotected oral, vaginal, or anal sex.  Having several sex partners.  Having a sex partner who has other sex partners.  Having or having had another STI.  Having a weak disease-fighting (immune) system.  Having damaged skin in the genital area. What are the signs or symptoms? Most people who have HPV do not have any symptoms. If symptoms are present, they may include:  Wartlike lesions in the throat (from having oral sex).  Warts on the infected skin or mucous membranes.  Genital warts that may itch, burn, bleed, or be painful during sexual intercourse. How is this diagnosed? If wartlike lesions are present in the throat or if genital warts are present, your health care provider can usually diagnose HPV with a physical exam. Genital warts are easily seen. In  females, tests may be used to diagnose HPV, including:  A Pap test. A Pap test takes a sample of cells from your cervix to check for cancer and HPV infection.  An HPV test. This is similar to a Pap test and involves taking a sample of cells from your cervix.  Using a scope to view the cervix (colposcopy). This may be done if a pelvic exam or Pap test is abnormal. A sample of tissue may be removed for testing (biopsy) during the colposcopy. Currently, there is no test to detect HPV in males. How is this treated? There is no treatment for the virus itself. However, there are treatments for the health problems and symptoms HPV can cause. Your health care provider will monitor you closely after you are treated as HPV can come back and may need treatment again. Treatment for HPV may include:  Medicines, which may be injected or applied to genital warts in a cream, lotion, liquid or gel form.  Use of a probe to apply extreme cold (cryotherapy) to the genital warts.  Application of an intense beam of light (laser treatment) on the genital warts.  Use of a probe to apply extreme heat (electrocautery) on the genital warts.  Surgery to remove the genital warts. Follow these instructions at home: Medicines  Take over-the-counter and prescription medicines only as told by your health care provider. This include creams for itching or irritation.  Do not treat genital warts with medicines used for treating hand warts. General instructions  Do not touch or scratch the warts.  Do not have sex while you are being treated.  Do not douche or use tampons during treatment (women).  Tell your sex partner about your infection. He or she may also need to be treated.  If you become pregnant, tell your health care provider that you have HPV. Your health care provider will monitor you closely during pregnancy to make sure your baby is safe.  Keep all follow-up visits as told by your health care provider.  This is important. How is this prevented?  Talk with your health care provider about getting the HPV vaccines. These vaccines prevent some HPV infections and cancers. The vaccines are recommended for males and females between the ages of 40 and 93. They will not work if you already have HPV, and they are not recommended for pregnant women.  After treatment, use condoms during sex to prevent future infections.  Have only one sex partner.  Have a sex partner who does not have other sex partners.  Get regular Pap tests as directed by your health care provider. Contact a health care provider if:  The treated skin becomes red, swollen, or painful.  You have a fever.  You feel generally ill.  You feel lumps or pimples sticking out in and around your genital  area.  You develop bleeding of the vagina or the treatment area.  You have painful sexual intercourse. Summary  Human papillomavirus (HPV) is the most common sexually transmitted infection (STI) and is highly contagious.  Most people carrying HPV do not have any symptoms.  HPV can be prevented with vaccination. The vaccine is recommended for males and females between the ages of 26 and 63.  There is no treatment for the virus itself. However, there are treatments for the health problems and symptoms HPV can cause. This information is not intended to replace advice given to you by your health care provider. Make sure you discuss any questions you have with your health care provider. Document Released: 03/13/2003 Document Revised: 11/30/2015 Document Reviewed: 11/30/2015 Elsevier Interactive Patient Education  2019 [employer] Inc.  Endometrial Biopsy Post-procedure Instructions . Cramping is common.  You may take Ibuprofen, Aleve, or Tylenol for the cramping.  This should resolve within 24 hours.   . You may have a small amount of spotting.  You should wear a mini pad for the next few days. . You may have intercourse in 24  hours. . You need to call the office if you have any pelvic pain, fever, heavy bleeding, or foul smelling vaginal discharge. . Shower or bathe as normal . You will be notified within one week of your biopsy results or we will discuss your results at your follow-up appointment if needed.

## 2018-04-07 NOTE — Progress Notes (Signed)
GYNECOLOGY  VISIT   HPI: 43 y.o.   Divorced White or Caucasian Not Hispanic or Latino  female   G1P1001 with Patient's last menstrual period was 04/02/2018.   here for  Heavy vaginal bleeding  Cycles have been ~25 days x 4-5 days. She can saturate a super tampon in 3 hours. She gets severe cramps (h/o endometriosis). Last cycle was 3/11-3/15, then started bleeding again on 04/03/18. This cycle is very heavy, saturating a super tampon in one hour.   She has had significant bleeding with intercourse. No dyspareunia.   She wasn't sexually active for a long time. She has had a new partner for the last 4 months. She had negative cervical cultures, didn't have blood work for [employer]. Not using condoms. 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   GYNECOLOGIC HISTORY: Patient's last menstrual period was 04/02/2018. Contraception:btl Menopausal hormone therapy: none        OB History    Gravida  1   Para  1   Term  1   Preterm      AB      Living  1     SAB      TAB      Ectopic      Multiple      Live Births  1              There are no active problems to display for this patient.   Past Medical History:  Diagnosis Date  . Anxiety   . Endometriosis     Past Surgical History:  Procedure Laterality Date  . CHOLECYSTECTOMY, LAPAROSCOPIC  2004   Dr. Rayburn Ma  . TUBAL LIGATION  2000    Current Outpatient Medications  Medication Sig Dispense Refill  . citalopram (CELEXA) 20 MG tablet 1/2 tab (10mg ) daily for two weeks and then increase to 20mg . 30 tablet 1  . Multiple Vitamin (MULTIVITAMIN) tablet Take 1 tablet by mouth daily.    . Turmeric 400 MG CAPS Take by mouth.     No current facility-administered medications for this visit.      ALLERGIES: Patient has no known allergies.  Family History  Problem Relation Age of Onset  . Lymphoma Father   . Diabetes Maternal Grandmother   . Colon cancer Maternal Grandfather      Social History   Socioeconomic History  . Marital status: Divorced    Spouse name: Not on file  . Number of children: Not on file  . Years of education: Not on file  . Highest education level: Not on file  Occupational History  . Not on file  Social Needs  . Financial resource strain: Not on file  . Food insecurity:    Worry: Not on file    Inability: Not on file  . Transportation needs:    Medical: Not on file    Non-medical: Not on file  Tobacco Use  . Smoking status: Never Smoker  . Smokeless tobacco: Never Used  Substance and Sexual Activity  . Alcohol use: Yes    Alcohol/week: 0.0 - 2.0 standard drinks  . Drug use: No  . Sexual activity: Yes    Birth control/protection: Surgical    Comment: Tubal Ligation   Lifestyle  . Physical activity:    Days per week: Not on file    Minutes per session: Not on file  . Stress: Not on file  Relationships  . Social connections:  Talks on phone: Not on file    Gets together: Not on file    Attends religious service: Not on file    Active member of club or organization: Not on file    Attends meetings of clubs or organizations: Not on file    Relationship status: Not on file  . Intimate partner violence:    Fear of current or ex partner: Not on file    Emotionally abused: Not on file    Physically abused: Not on file    Forced sexual activity: Not on file  Other Topics Concern  . Not on file  Social History Narrative  . Not on file    Review of Systems  Constitutional: Negative.   HENT: Negative.   Eyes: Negative.   Respiratory: Negative.   Cardiovascular: Negative.   Gastrointestinal: Negative.   Genitourinary:       Heavy bleeding & pain  Musculoskeletal: Negative.   Skin: Negative.   Neurological: Negative.   Endo/Heme/Allergies: Negative.   Psychiatric/Behavioral: Negative.     PHYSICAL EXAMINATION:    BP 108/70   Temp 98 F (36.7 C) (Oral)   Wt 183 lb (83 kg)   LMP 04/02/2018   BMI 31.91  kg/m     General appearance: alert, cooperative and appears stated age Abdomen: soft, non-tender; non distended, no masses,  no organomegaly  Pelvic: External genitalia:  no lesions              Urethra:  normal appearing urethra with no masses, tenderness or lesions              Bartholins and Skenes: normal                 Vagina: normal appearing vagina with normal color and discharge, no lesions. Moderate amount of blood in her vagina.              Cervix: no lesions and friable appearing              Bimanual Exam:  Uterus:  normal size, contour, position, consistency, mobility, non-tender and anteverted              Adnexa: fullness on the left (suspect stool), not tender              Rectovaginal: Yes.  .  Confirms.              Anus:  normal sphincter tone, no lesions  The risks of office endometrial curettage were reviewed and a consent was obtained.  A speculum was placed in the vagina and the cervix was cleansed with betadine. A tenaculum was placed on the cervix and the uterine evacuator was placed into the endometrial cavity. The uterus sounded to 8 cm. The endometrial curettage was performed, moderate tissue was obtained. The cavity had the characteristic gritty feeling at the end of the procedure. The tenaculum and speculum were removed. There were no complications.    Chaperone was present for exam.  ASSESSMENT AUB ASCUS, hpv pap Postcoital bleeding Multiple skin color papules, ? Molluscum or warts    PLAN UPT negative Endometrial curettage Provera 10 mg BID until bleeding stops then one tablet a day for 10 days. Hgb 12.6 CBC, ferritin, HIV, RPR Return for colposcopy, reviewed hpv, and abnormal pap Vulvar biopsy at time of colposcopy Return for GYN ultrasound Start gardasil series today   An After Visit Summary was printed and given to the patient.  ~40 minutes face to  face time of which over 50% was spent in counseling.

## 2018-04-07 NOTE — Addendum Note (Signed)
Addended by: Tobi Bastos on: 04/07/2018 02:11 PM   Modules accepted: Orders

## 2018-04-08 LAB — RPR: RPR Ser Ql: NONREACTIVE

## 2018-04-08 LAB — CBC
Hematocrit: 36.5 % (ref 34.0–46.6)
Hemoglobin: 12.5 g/dL (ref 11.1–15.9)
MCH: 29.4 pg (ref 26.6–33.0)
MCHC: 34.2 g/dL (ref 31.5–35.7)
MCV: 86 fL (ref 79–97)
Platelets: 415 10*3/uL (ref 150–450)
RBC: 4.25 x10E6/uL (ref 3.77–5.28)
RDW: 12.6 % (ref 11.7–15.4)
WBC: 8.4 10*3/uL (ref 3.4–10.8)

## 2018-04-08 LAB — HIV ANTIBODY (ROUTINE TESTING W REFLEX): HIV Screen 4th Generation wRfx: NONREACTIVE

## 2018-04-08 LAB — FERRITIN: Ferritin: 19 ng/mL (ref 15–150)

## 2018-04-12 ENCOUNTER — Other Ambulatory Visit: Payer: Self-pay

## 2018-04-13 ENCOUNTER — Ambulatory Visit (INDEPENDENT_AMBULATORY_CARE_PROVIDER_SITE_OTHER): Payer: BLUE CROSS/BLUE SHIELD | Admitting: Obstetrics and Gynecology

## 2018-04-13 ENCOUNTER — Encounter: Payer: Self-pay | Admitting: Obstetrics and Gynecology

## 2018-04-13 ENCOUNTER — Ambulatory Visit (INDEPENDENT_AMBULATORY_CARE_PROVIDER_SITE_OTHER): Payer: BLUE CROSS/BLUE SHIELD

## 2018-04-13 VITALS — BP 122/86 | HR 68 | Temp 98.1°F | Ht 63.5 in | Wt 182.0 lb

## 2018-04-13 DIAGNOSIS — N939 Abnormal uterine and vaginal bleeding, unspecified: Secondary | ICD-10-CM | POA: Diagnosis not present

## 2018-04-13 DIAGNOSIS — N83202 Unspecified ovarian cyst, left side: Secondary | ICD-10-CM

## 2018-04-13 NOTE — Progress Notes (Signed)
GYNECOLOGY  VISIT   HPI: 43 y.o.   Divorced White or Caucasian Not Hispanic or Latino  female   G1P1001 with Patient's last menstrual period was 04/02/2018.  here for evaluation of AUB and postcoital bleeding. She had an office curettage last week, pathology with secretory endometrium. UPT was negative, hgb was 12.5 with a low ferritin. She started oral iron, will take for one month.  She had an abnormal pap recently and is scheduled for a colposcopy. Other STD testing was negative.  She was having issues with postcoital bleeding, she was treated for BV and hasn't had bleeding the last 2 times she has been sexually active.   GYNECOLOGIC HISTORY: Patient's last menstrual period was 04/02/2018. Contraception:Tubal ligation Menopausal hormone therapy: NA        OB History    Gravida  1   Para  1   Term  1   Preterm      AB      Living  1     SAB      TAB      Ectopic      Multiple      Live Births  1              There are no active problems to display for this patient.   Past Medical History:  Diagnosis Date  . Anxiety   . Endometriosis     Past Surgical History:  Procedure Laterality Date  . CHOLECYSTECTOMY, LAPAROSCOPIC  2004   Dr. Rayburn Ma  . TUBAL LIGATION  2000    Current Outpatient Medications  Medication Sig Dispense Refill  . citalopram (CELEXA) 20 MG tablet 1/2 tab (10mg ) daily for two weeks and then increase to 20mg . 30 tablet 1  . medroxyPROGESTERone (PROVERA) 10 MG tablet 1 tablet BID until bleeding stops, then one tablet a day for 10 days. 20 tablet 0  . Multiple Vitamin (MULTIVITAMIN) tablet Take 1 tablet by mouth daily.    . Turmeric 400 MG CAPS Take by mouth.     No current facility-administered medications for this visit.      ALLERGIES: Patient has no known allergies.  Family History  Problem Relation Age of Onset  . Lymphoma Father   . Diabetes Maternal Grandmother   . Colon cancer Maternal Grandfather     Social History    Socioeconomic History  . Marital status: Divorced    Spouse name: Not on file  . Number of children: Not on file  . Years of education: Not on file  . Highest education level: Not on file  Occupational History  . Not on file  Social Needs  . Financial resource strain: Not on file  . Food insecurity:    Worry: Not on file    Inability: Not on file  . Transportation needs:    Medical: Not on file    Non-medical: Not on file  Tobacco Use  . Smoking status: Never Smoker  . Smokeless tobacco: Never Used  Substance and Sexual Activity  . Alcohol use: Yes    Alcohol/week: 0.0 - 2.0 standard drinks  . Drug use: No  . Sexual activity: Yes    Birth control/protection: Surgical    Comment: Tubal Ligation   Lifestyle  . Physical activity:    Days per week: Not on file    Minutes per session: Not on file  . Stress: Not on file  Relationships  . Social connections:    Talks on phone: Not on  file    Gets together: Not on file    Attends religious service: Not on file    Active member of club or organization: Not on file    Attends meetings of clubs or organizations: Not on file    Relationship status: Not on file  . Intimate partner violence:    Fear of current or ex partner: Not on file    Emotionally abused: Not on file    Physically abused: Not on file    Forced sexual activity: Not on file  Other Topics Concern  . Not on file  Social History Narrative  . Not on file    Review of Systems  Constitutional: Negative.   HENT: Negative.   Eyes: Negative.   Respiratory: Negative.   Cardiovascular: Negative.   Gastrointestinal: Negative.   Genitourinary: Negative.   Musculoskeletal: Negative.   Skin: Negative.   Neurological: Negative.   Endo/Heme/Allergies: Negative.   Psychiatric/Behavioral: Negative.   All other systems reviewed and are negative.   PHYSICAL EXAMINATION:    LMP 04/02/2018     General appearance: alert, cooperative and appears stated  age  Ultrasound images reviewed with the patient. Images concerning for possible adenomyosis. 5 cm left ovarian cyst with debris.   ASSESSMENT AUB x 1, secretory endometrium on biopsy, possible adenomyosis on ultrasound Left ovarian cyst, ~5 cm with some debris, suspect hemorrhagic CL Postcoital bleeding, recently better since being treated for BV ASCUS, +HPV pap Vulvar lesion    PLAN She is on provera, not currently bleeding, thin endometrium on ultrasound Will see how her next few cycles go, discussed possible treatment if AUB persists Discussed risk of ovarian cyst rupture or torsion F/U ultrasound in 3 months Return for colposcopy and vulvar biopsy next week    An After Visit Summary was printed and given to the patient.  ~15 minutes face to face time of which over 50% was spent in counseling.    CC: Dr Hyacinth MeekerMiller

## 2018-04-13 NOTE — Patient Instructions (Signed)
Ovarian Cyst         An ovarian cyst is a fluid-filled sac that forms on an ovary. The ovaries are small organs that produce eggs in women. Various types of cysts can form on the ovaries. Some may cause symptoms and require treatment. Most ovarian cysts go away on their own, are not cancerous (are benign), and do not cause problems.  Common types of ovarian cysts include:  · Functional (follicle) cysts.  ? Occur during the menstrual cycle, and usually go away with the next menstrual cycle if you do not get pregnant.  ? Usually cause no symptoms.  · Endometriomas.  ? Are cysts that form from the tissue that lines the uterus (endometrium).  ? Are sometimes called “chocolate cysts” because they become filled with blood that turns brown.  ? Can cause pain in the lower abdomen during intercourse and during your period.  · Cystadenoma cysts.  ? Develop from cells on the outside surface of the ovary.  ? Can get very large and cause lower abdomen pain and pain with intercourse.  ? Can cause severe pain if they twist or break open (rupture).  · Dermoid cysts.  ? Are sometimes found in both ovaries.  ? May contain different kinds of body tissue, such as skin, teeth, hair, or cartilage.  ? Usually do not cause symptoms unless they get very big.  · Theca lutein cysts.  ? Occur when too much of a certain hormone (human chorionic gonadotropin) is produced and overstimulates the ovaries to produce an egg.  ? Are most common after having procedures used to assist with the conception of a baby (in vitro fertilization).  What are the causes?  Ovarian cysts may be caused by:  · Ovarian hyperstimulation syndrome. This is a condition that can develop from taking fertility medicines. It causes multiple large ovarian cysts to form.  · Polycystic ovarian syndrome (PCOS). This is a common hormonal disorder that can cause ovarian cysts, as well as problems with your period or fertility.  What increases the risk?  The following factors may  make you more likely to develop ovarian cysts:  · Being overweight or obese.  · Taking fertility medicines.  · Taking certain forms of hormonal birth control.  · Smoking.  What are the signs or symptoms?  Many ovarian cysts do not cause symptoms. If symptoms are present, they may include:  · Pelvic pain or pressure.  · Pain in the lower abdomen.  · Pain during sex.  · Abdominal swelling.  · Abnormal menstrual periods.  · Increasing pain with menstrual periods.  How is this diagnosed?  These cysts are commonly found during a routine pelvic exam. You may have tests to find out more about the cyst, such as:  · Ultrasound.  · X-ray of the pelvis.  · CT scan.  · MRI.  · Blood tests.  How is this treated?  Many ovarian cysts go away on their own without treatment. Your health care provider may want to check your cyst regularly for 2-3 months to see if it changes. If you are in menopause, it is especially important to have your cyst monitored closely because menopausal women have a higher rate of ovarian cancer.  When treatment is needed, it may include:  · Medicines to help relieve pain.  · A procedure to drain the cyst (aspiration).  · Surgery to remove the whole cyst.  · Hormone treatment or birth control pills. These methods are sometimes used   to help dissolve a cyst.  Follow these instructions at home:  · Take over-the-counter and prescription medicines only as told by your health care provider.  · Do not drive or use heavy machinery while taking prescription pain medicine.  · Get regular pelvic exams and Pap tests as often as told by your health care provider.  · Return to your normal activities as told by your health care provider. Ask your health care provider what activities are safe for you.  · Do not use any products that contain nicotine or tobacco, such as cigarettes and e-cigarettes. If you need help quitting, ask your health care provider.  · Keep all follow-up visits as told by your health care provider.  This is important.  Contact a health care provider if:  · Your periods are late, irregular, or painful, or they stop.  · You have pelvic pain that does not go away.  · You have pressure on your bladder or trouble emptying your bladder completely.  · You have pain during sex.  · You have any of the following in your abdomen:  ? A feeling of fullness.  ? Pressure.  ? Discomfort.  ? Pain that does not go away.  ? Swelling.  · You feel generally ill.  · You become constipated.  · You lose your appetite.  · You develop severe acne.  · You start to have more body hair and facial hair.  · You are gaining weight or losing weight without changing your exercise and eating habits.  · You think you may be pregnant.  Get help right away if:  · You have abdominal pain that is severe or gets worse.  · You cannot eat or drink without vomiting.  · You suddenly develop a fever.  · Your menstrual period is much heavier than usual.  This information is not intended to replace advice given to you by your health care provider. Make sure you discuss any questions you have with your health care provider.  Document Released: 12/21/2004 Document Revised: 07/11/2015 Document Reviewed: 05/25/2015  Elsevier Interactive Patient Education © 2019 [employer]

## 2018-04-19 ENCOUNTER — Ambulatory Visit: Payer: BLUE CROSS/BLUE SHIELD | Admitting: Obstetrics & Gynecology

## 2018-04-21 ENCOUNTER — Encounter: Payer: Self-pay | Admitting: Obstetrics and Gynecology

## 2018-04-21 ENCOUNTER — Ambulatory Visit (INDEPENDENT_AMBULATORY_CARE_PROVIDER_SITE_OTHER): Payer: BLUE CROSS/BLUE SHIELD | Admitting: Obstetrics and Gynecology

## 2018-04-21 ENCOUNTER — Other Ambulatory Visit: Payer: Self-pay

## 2018-04-21 VITALS — BP 110/80 | HR 76 | Temp 98.5°F | Ht 63.5 in | Wt 187.8 lb

## 2018-04-21 DIAGNOSIS — D280 Benign neoplasm of vulva: Secondary | ICD-10-CM | POA: Diagnosis not present

## 2018-04-21 DIAGNOSIS — R87610 Atypical squamous cells of undetermined significance on cytologic smear of cervix (ASC-US): Secondary | ICD-10-CM

## 2018-04-21 DIAGNOSIS — F419 Anxiety disorder, unspecified: Secondary | ICD-10-CM | POA: Diagnosis not present

## 2018-04-21 DIAGNOSIS — N870 Mild cervical dysplasia: Secondary | ICD-10-CM | POA: Diagnosis not present

## 2018-04-21 DIAGNOSIS — N9089 Other specified noninflammatory disorders of vulva and perineum: Secondary | ICD-10-CM | POA: Diagnosis not present

## 2018-04-21 DIAGNOSIS — A630 Anogenital (venereal) warts: Secondary | ICD-10-CM | POA: Diagnosis not present

## 2018-04-21 DIAGNOSIS — N939 Abnormal uterine and vaginal bleeding, unspecified: Secondary | ICD-10-CM

## 2018-04-21 MED ORDER — CITALOPRAM HYDROBROMIDE 20 MG PO TABS: ORAL_TABLET | ORAL | 1 refills | Status: DC

## 2018-04-21 NOTE — Progress Notes (Signed)
GYNECOLOGY  VISIT   HPI: 43 y.o.   Divorced White or Caucasian Not Hispanic or Latino  female   [id number] with Patient's last menstrual period was 04/02/2018.   here for  Colposcopy  For ASCUS, +HPV pap We increased her Celexa from 10 mg to 20 mg 2 weeks ago. She is feeling better, but still a little edgy.  She was seen earlier this month with AUB, negative biopsy, U/S with likely adenomyosis, 5 cm complex left ovarian cyst.  She was treated with provera, no longer bleeding.  She was treated for BV last month, no postcoital bleeding since then.   GYNECOLOGIC HISTORY: Patient's last menstrual period was 04/02/2018. Contraception: BTL Menopausal hormone therapy: none        OB History    Gravida  1   Para  1   Term  1   Preterm      AB      Living  1     SAB      TAB      Ectopic      Multiple      Live Births  1              There are no active problems to display for this patient.   Past Medical History:  Diagnosis Date  . Anxiety   . Endometriosis     Past Surgical History:  Procedure Laterality Date  . CHOLECYSTECTOMY, LAPAROSCOPIC  2004   Dr. Rayburn MaBlackmon  . TUBAL LIGATION  2000    Current Outpatient Medications  Medication Sig Dispense Refill  . citalopram (CELEXA) 20 MG tablet 1/2 tab (10mg ) daily for two weeks and then increase to 20mg . 30 tablet 1  . Multiple Vitamin (MULTIVITAMIN) tablet Take 1 tablet by mouth daily.    . Turmeric 400 MG CAPS Take by mouth.     No current facility-administered medications for this visit.      ALLERGIES: Patient has no known allergies.  Family History  Problem Relation Age of Onset  . Lymphoma Father   . Diabetes Maternal Grandmother   . Colon cancer Maternal Grandfather     Social History   Socioeconomic History  . Marital status: Divorced    Spouse name: Not on file  . Number of children: Not on file  . Years of education: Not on file  . Highest education level: Not on file  Occupational  History  . Not on file  Social Needs  . Financial resource strain: Not on file  . Food insecurity:    Worry: Not on file    Inability: Not on file  . Transportation needs:    Medical: Not on file    Non-medical: Not on file  Tobacco Use  . Smoking status: Never Smoker  . Smokeless tobacco: Never Used  Substance and Sexual Activity  . Alcohol use: Yes    Alcohol/week: 0.0 - 2.0 standard drinks  . Drug use: No  . Sexual activity: Yes    Birth control/protection: Surgical    Comment: Tubal Ligation   Lifestyle  . Physical activity:    Days per week: Not on file    Minutes per session: Not on file  . Stress: Not on file  Relationships  . Social connections:    Talks on phone: Not on file    Gets together: Not on file    Attends religious service: Not on file    Active member of club or organization: Not on file  Attends meetings of clubs or organizations: Not on file    Relationship status: Not on file  . Intimate partner violence:    Fear of current or ex partner: Not on file    Emotionally abused: Not on file    Physically abused: Not on file    Forced sexual activity: Not on file  Other Topics Concern  . Not on file  Social History Narrative  . Not on file    Review of Systems  Constitutional: Negative.   HENT: Negative.   Eyes: Negative.   Respiratory: Negative.   Cardiovascular: Negative.   Gastrointestinal: Negative.   Genitourinary:       Pain with intercourse Excess bleeding  Painful periods Menstrual cycle changes unscheduled bleeding   Musculoskeletal: Negative.   Skin: Negative.   Neurological: Negative.   Endo/Heme/Allergies: Negative.   Psychiatric/Behavioral: Negative.   All other systems reviewed and are negative.   PHYSICAL EXAMINATION:    BP 110/80   Pulse 76   Temp 98.5 F (36.9 C) (Oral)   Ht 5' 3.5" (1.613 m)   Wt 187 lb 12.8 oz (85.2 kg)   LMP 04/02/2018   BMI 32.75 kg/m     General appearance: alert, cooperative and  appears stated age  Pelvic: External genitalia:  Several skin colored papules on the vulva, largest on the lower left vulva              Urethra:  normal appearing urethra with no masses, tenderness or lesions              Bartholins and Skenes: normal                 Vagina: normal appearing vagina with normal color and discharge, no lesions              Cervix: no lesions and +ectropian               Colposcopy: satisfactory, mild aceto-white changes at 1 o'clock, biopsy taken, cervix slightly friable. ECC done. Biopsy site treated with silver nitrate. Negative lugols of cervix and upper vagina  The risks of the procedure were reviewed with the patient and a consent was signed. The area was cleansed with betadine and injected with 1% lidocaine. A 4 mm punch biopsy was used to remove a circular piece of tissue. The defect was closed with 3-0 vicryl. The patient tolerated the procedure well.   Chaperone was present for exam.  ASSESSMENT ASCUS, +HPV pap Vulvar lesions Anxiety AUB, negative evaluation, doing fine s/p provera Left ovarian cyst    PLAN Colposcopy with biopsy and ECC Vulvar biopsy Continue with current dose of Celexa, she will call in 2 weeks and let me know if she wants to continue on 20 mg a day or go up to 30 mg a day She will calendar her cycles and f/u in 3 months for ultrasound and review of her cycles.     An After Visit Summary was printed and given to the patient.  ~15 minutes face to face time of which over 50% was spent in counseling.

## 2018-04-21 NOTE — Patient Instructions (Signed)
Colposcopy Post-procedure Instructions Cramping is common.  You may take Ibuprofen, Aleve, or Tylenol for the cramping.  This should resolve within the next two to three days.   You may have bright red spotting or blackish discharge for several days after your procedure.  The discharge occurs because of a topical solution used to stop bleeding at the biopsy site(s).  You should wear a mini pad for the next few days. Refrain from putting anything in the vagina until the bleeding and/or discharge stops (usually less than a week). You need to call the office if you have any pelvic pain, fever, heavy bleeding, or foul smelling vaginal discharge. Shower or bathe as normal You will be notified within one week of your biopsy results or we will discuss your results at your follow-up appointment if needed. Vulvar Biopsy Post-procedure Instructions You may take Ibuprofen, Aleve, or Tylenol for any pain or discomfort. You may have a small amount of spotting.  You should wear a mini pad for the next few days. You may use some topical Neosporin ointment (over the counter) if you would like.   You will need to call the office if you have redness around the biopsy site, if there is any unusual drainage, if the bleeding is heavy, or if you have any concerns.   Shower or bathe as normal You will be notified within one week of your biopsy results or we will discuss your results at your follow-up appointment if needed.  

## 2018-05-01 ENCOUNTER — Telehealth: Payer: Self-pay

## 2018-05-01 NOTE — Telephone Encounter (Signed)
-----   Message from Cassidy Bolk, MD sent at 04/27/2018  2:53 PM EDT ----- Please let the patient know that her cervical biopsy returned with LSIL, the ECC was benign as was the vulvar biopsy (benign growth, not an STD). She needs a f/u pap with hpv at her annual exam next year. She can't get into mychart, so please call with the info.

## 2018-05-01 NOTE — Telephone Encounter (Signed)
Spoke with patient. Results given. Patient verbalizes understanding. 08 recall entered. Encounter closed. 

## 2018-06-01 DIAGNOSIS — Z1159 Encounter for screening for other viral diseases: Secondary | ICD-10-CM | POA: Diagnosis not present

## 2018-06-01 DIAGNOSIS — R51 Headache: Secondary | ICD-10-CM | POA: Diagnosis not present

## 2018-06-01 DIAGNOSIS — B0089 Other herpesviral infection: Secondary | ICD-10-CM | POA: Diagnosis not present

## 2018-06-16 ENCOUNTER — Telehealth: Payer: Self-pay | Admitting: Obstetrics and Gynecology

## 2018-06-16 NOTE — Telephone Encounter (Signed)
Patient has a question for Dr.Jertson, no details given.

## 2018-06-16 NOTE — Telephone Encounter (Signed)
Spoke with patient. She is currently taking Citalopram 20mg  daily and it helps with her anxiety, but she has a constant dull headache everyday since starting. She had a headache even when she was taking 10mg  daily. She thought this may resolve after being on Citalopram for a while, but it has not. Patient wants to know if there is something else she can try for her anxiety? Patient knows Dr.Jertson is out of office today. She states okay to wait until Monday to speak with Dr.Jertson. No suicidal ideations. Routed to provider. Confirmed pharmacy on file.

## 2018-06-19 MED ORDER — FLUOXETINE HCL 20 MG PO CAPS: 20.0000 mg | ORAL_CAPSULE | Freq: Every day | ORAL | 1 refills | Status: DC

## 2018-06-19 NOTE — Telephone Encounter (Signed)
We can try changing her to another SSRI. Celexa isn't one that is as commonly associated with headaches as some of the others (ie lexapro). I saw from reviewing the chart that she had previously been on Zoloft. If she wants to try the Zoloft instead, she can stop the Celexa and the next day start of Zoloft 50 mg a day. If she wants to do this call in #30 with one refill.  She has also been on Wellbutrin in the past, that is typically better for depression, not anxiety. The other option is to refer her to a Primary Care MD or Psychiatrist

## 2018-06-19 NOTE — Telephone Encounter (Signed)
Patient states she gained a lot of weight on Zoloft. Wellbutrin worked okay in the past, but I explained it was better for depression not anxiety. Patient asking about Prozac? Advised she may need to see PCP.Will ask Dr.Jertson and call her back. Routing to provider.

## 2018-06-19 NOTE — Telephone Encounter (Signed)
Spoke with patient. Notified headaches are more common with Prozac than with Celexa or Zoloft. Patient still wants to try Prozac. Advised if Prozac gives headaches or doesn't help and she needs something beyond [facility], Dr.Jertson will need to refer her to someone. Also advised she could see her PCP if this didn't work. Patient voiced understanding.  Prozac 20mg  #30, 1 po daily, 1R

## 2018-06-19 NOTE — Telephone Encounter (Signed)
Headaches are more common with Prozac than Celexa or Zoloft, but that doesn't mean she will have headaches on it. I'm happy to have her try it if she desires. If she wants to try Prozac, please call in 20 mg tablets, #30 with one refill.  If she wants something beyond [facility], I will need to refer her out.

## 2018-08-04 ENCOUNTER — Other Ambulatory Visit: Payer: Self-pay | Admitting: Obstetrics and Gynecology

## 2018-08-04 NOTE — Telephone Encounter (Signed)
Medication refill request: Prozac 20 mg #30 Last AEX:  03-24-18 Next AEX: none Last MMG (if hormonal medication request): never Refill authorized: Please approve if appropriate

## 2018-08-10 ENCOUNTER — Other Ambulatory Visit: Payer: Self-pay | Admitting: *Deleted

## 2018-08-10 DIAGNOSIS — N83202 Unspecified ovarian cyst, left side: Secondary | ICD-10-CM

## 2018-08-18 ENCOUNTER — Telehealth: Payer: Self-pay | Admitting: Obstetrics and Gynecology

## 2018-08-18 NOTE — Telephone Encounter (Signed)
Patient canceled her upcoming PUS appointment 08/22/18. 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 She said she will call later to reschedule once she has more information.

## 2018-08-22 ENCOUNTER — Other Ambulatory Visit: Payer: BC Managed Care – PPO

## 2018-08-22 ENCOUNTER — Other Ambulatory Visit: Payer: BC Managed Care – PPO | Admitting: Obstetrics and Gynecology

## 2018-12-13 ENCOUNTER — Other Ambulatory Visit: Payer: Self-pay

## 2018-12-13 DIAGNOSIS — Z20822 Contact with and (suspected) exposure to covid-19: Secondary | ICD-10-CM

## 2018-12-15 LAB — NOVEL CORONAVIRUS, NAA: SARS-CoV-2, NAA: DETECTED — AB

## 2019-03-16 ENCOUNTER — Telehealth: Payer: Self-pay | Admitting: *Deleted

## 2019-03-16 NOTE — Telephone Encounter (Signed)
Would like to wean off antidepressant.

## 2019-03-16 NOTE — Telephone Encounter (Signed)
Spoke with patient. Patient no longer on Celax and would like to wean off of Prozac 20 mg daily. Patient states she has gained 25 lbs since starting medication, no other lifestyle changes. Patient states things are stable in her life now, would like to come off of medication.   Advised patient I will review with Dr. Oscar La and f/u with recommendations. Advised patient Dr. Oscar La is not in the office today, response may not be immediate, patient agreeable.   Dr. Oscar La -please advise.

## 2019-03-19 NOTE — Telephone Encounter (Signed)
Call to patient, mailbox full, unable to leave message.  

## 2019-03-19 NOTE — Telephone Encounter (Signed)
Prozac is long acting which typically makes it easier to wean off. She can go down to 1/2 tablet for 2 weeks then stop it. Call with any concerns

## 2019-03-21 ENCOUNTER — Encounter: Payer: Self-pay | Admitting: Obstetrics and Gynecology

## 2019-03-21 ENCOUNTER — Telehealth: Payer: Self-pay | Admitting: *Deleted

## 2019-03-21 NOTE — Telephone Encounter (Signed)
Opened in error. Encounter closed.

## 2019-03-21 NOTE — Telephone Encounter (Signed)
See telephone encounter dated 03/16/19.   Encounter closed.

## 2019-03-21 NOTE — Telephone Encounter (Signed)
Call to patient, left detailed message, ok per dpr. Advised as seen below per Dr. Oscar La, return call to office if any additional questions.   Encounter closed.

## 2019-03-21 NOTE — Telephone Encounter (Signed)
[name], Cassidy R "Mindy"  P [facility]  Phone Number: [phone]  How do I start winging off my Prozac ?

## 2019-03-23 ENCOUNTER — Encounter: Payer: Self-pay | Admitting: Obstetrics and Gynecology

## 2019-03-23 ENCOUNTER — Telehealth: Payer: Self-pay | Admitting: *Deleted

## 2019-03-23 NOTE — Telephone Encounter (Signed)
See telephone encounter dated 03/23/19.   Encounter closed.  

## 2019-03-23 NOTE — Telephone Encounter (Signed)
[name], Cassidy R "Mindy"  P [facility]  Phone Number: [phone]  To wing off Prozac, I'm taking capsules can't cut in half like I was told    Patient was previously advised to take 1/2 tab for 2wks and then stop. Patient is taking capsules, please advise.

## 2019-03-27 MED ORDER — FLUOXETINE HCL 10 MG PO CAPS: 10.0000 mg | ORAL_CAPSULE | Freq: Every day | ORAL | 0 refills | Status: AC

## 2019-03-27 NOTE — Telephone Encounter (Signed)
Please let the patient know that I sent in a 2 week supply of prozac 10 mg

## 2019-03-27 NOTE — Telephone Encounter (Signed)
Spoke to pt. Pt given update on Prozac Rx. Pt agreeable and verbalized understanding.   Routing to Dr Oscar La for review and will close encounter.

## 2019-06-22 ENCOUNTER — Telehealth: Payer: Self-pay

## 2019-06-22 NOTE — Telephone Encounter (Signed)
Left message to call Cassidy [name] at [phone].  Patient is in 08 recall. Patient is due for follow up pap and HPV testing as her cervical biopsy on 04/21/2018 with Dr.Jertson showed LSIL with a negative ECC. Call to patient to assist with scheduling annual exam with pap.

## 2019-07-20 NOTE — Telephone Encounter (Signed)
Letter mailed to patient's home address on file. Encounter closed. °

## 2020-02-26 DIAGNOSIS — B009 Herpesviral infection, unspecified: Secondary | ICD-10-CM | POA: Diagnosis not present
